# Patient Record
Sex: Female | Born: 1970 | Race: Black or African American | Hispanic: No | Marital: Married | Smoking: Never smoker
Health system: Southern US, Community
[De-identification: ages and names within clinical notes are randomized; demographics above are authoritative.]

## PROBLEM LIST (undated history)

## (undated) HISTORY — PX: TUBAL LIGATION: SHX77

## (undated) HISTORY — PX: APPENDECTOMY: SHX54

---

## 2013-09-23 ENCOUNTER — Ambulatory Visit (INDEPENDENT_AMBULATORY_CARE_PROVIDER_SITE_OTHER): Payer: BC Managed Care – PPO | Admitting: Physician Assistant

## 2013-09-23 VITALS — BP 110/70 | HR 65 | Temp 98.3°F | Resp 16 | Ht 67.5 in | Wt 194.0 lb

## 2013-09-23 DIAGNOSIS — Z0000 Encounter for general adult medical examination without abnormal findings: Secondary | ICD-10-CM

## 2013-09-23 DIAGNOSIS — Z1239 Encounter for other screening for malignant neoplasm of breast: Secondary | ICD-10-CM

## 2013-09-23 DIAGNOSIS — R8281 Pyuria: Secondary | ICD-10-CM

## 2013-09-23 DIAGNOSIS — Z111 Encounter for screening for respiratory tuberculosis: Secondary | ICD-10-CM

## 2013-09-23 LAB — POCT URINALYSIS DIPSTICK
Bilirubin, UA: NEGATIVE
Glucose, UA: NEGATIVE
Ketones, UA: NEGATIVE
Nitrite, UA: POSITIVE
Spec Grav, UA: 1.025
pH, UA: 7

## 2013-09-23 LAB — POCT UA - MICROSCOPIC ONLY: Mucus, UA: NEGATIVE

## 2013-09-23 LAB — POCT CBC
HCT, POC: 42.5 % (ref 37.7–47.9)
Hemoglobin: 13.2 g/dL (ref 12.2–16.2)
Lymph, poc: 2.1 (ref 0.6–3.4)
MCH, POC: 29.6 pg (ref 27–31.2)
MPV: 9.5 fL (ref 0–99.8)
POC MID %: 5.8 %M (ref 0–12)
RBC: 4.46 M/uL (ref 4.04–5.48)
WBC: 5.5 10*3/uL (ref 4.6–10.2)

## 2013-09-23 LAB — COMPREHENSIVE METABOLIC PANEL
ALT: 20 U/L (ref 0–35)
AST: 15 U/L (ref 0–37)
Albumin: 4.2 g/dL (ref 3.5–5.2)
Alkaline Phosphatase: 58 U/L (ref 39–117)
BUN: 9 mg/dL (ref 6–23)
CO2: 29 mEq/L (ref 19–32)
Calcium: 9.1 mg/dL (ref 8.4–10.5)
Chloride: 105 mEq/L (ref 96–112)
Potassium: 3.8 mEq/L (ref 3.5–5.3)
Sodium: 140 mEq/L (ref 135–145)
Total Protein: 7 g/dL (ref 6.0–8.3)

## 2013-09-23 LAB — TSH: TSH: 1.251 u[IU]/mL (ref 0.350–4.500)

## 2013-09-23 NOTE — Patient Instructions (Addendum)
Make your appt for your mammogram.  We will contact you with your labs within the week.

## 2013-09-23 NOTE — Progress Notes (Signed)
[address] [place] [address]   Phone [phone]  Subjective:    Patient ID: Laurie Mitchell, female    DOB: Nov 11, 1971, 42 y.o.   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  She also thinks that she needs a PPD for work.  She had her last CPE 2 years ago with a normal pap test.  Her only concern is that she has breast tenderness. This is not different for her she has been having this for a while and it does not seem to be associated with her menses.     Review of Systems  Constitutional: Negative.   HENT: Negative.   Eyes: Negative.   Respiratory: Negative.   Cardiovascular: Negative.   Gastrointestinal: Negative.   Endocrine: Negative.   Genitourinary: Negative.        Mild vaginal odor.  Musculoskeletal: Negative.   Skin: Negative.   Allergic/Immunologic: Negative.   Neurological: Negative.   Hematological: Negative.   Psychiatric/Behavioral: Negative.        Objective:   Physical Exam  Vitals reviewed. Constitutional: She is oriented to person, place, and time. She appears well-developed and well-nourished.  HENT:  Head: Normocephalic and atraumatic.  Right Ear: Hearing, tympanic membrane, external ear and ear canal normal.  Left Ear: Hearing, tympanic membrane, external ear and ear canal normal.  Nose: Nose normal.  Mouth/Throat: Uvula is midline, oropharynx is clear and moist and mucous membranes are normal.  Eyes: Conjunctivae are normal.  Neck: Neck supple.  Cardiovascular: Normal rate, regular rhythm and normal heart sounds.   No murmur heard. Pulmonary/Chest: Effort normal and breath sounds normal.  Abdominal: Soft. Bowel sounds are normal.  Genitourinary: There is breast tenderness (generalized). No breast swelling, discharge or bleeding.  Musculoskeletal: Normal range of motion.  Lymphadenopathy:    She has no cervical adenopathy.  Neurological: She is alert and oriented to person,  place, and time. She has normal reflexes.  Skin: Skin is warm and dry.  Psychiatric: She has a normal mood and affect. Her behavior is normal. Judgment and thought content normal.   Results for orders placed in visit on 09/23/13  POCT CBC      Result Value Range   WBC 5.5  4.6 - 10.2 K/uL   Lymph, poc 2.1  0.6 - 3.4   POC LYMPH PERCENT 38.0  10 - 50 %L   MID (cbc) 0.3  0 - 0.9   POC MID % 5.8  0 - 12 %M   POC Granulocyte 3.1  2 - 6.9   Granulocyte percent 56.2  37 - 80 %G   RBC 4.46  4.04 - 5.48 M/uL   Hemoglobin 13.2  12.2 - 16.2 g/dL   HCT, POC 65.7  84.6 - 47.9 %   MCV 95.3  80 - 97 fL   MCH, POC 29.6  27 - 31.2 pg   MCHC 31.1 (*) 31.8 - 35.4 g/dL   RDW, POC 96.2     Platelet Count, POC 311  142 - 424 K/uL   MPV 9.5  0 - 99.8 fL  POCT URINALYSIS DIPSTICK      Result Value Range   Color, UA yellow     Clarity, UA cloudy     Glucose, UA neg     Bilirubin, UA neg     Ketones, UA neg     Spec Grav, UA 1.025  Blood, UA moderate     pH, UA 7.0     Protein, UA neg     Urobilinogen, UA 1.0     Nitrite, UA positive     Leukocytes, UA Trace    POCT UA - MICROSCOPIC ONLY      Result Value Range   WBC, Ur, HPF, POC 8-15 with large clumps     RBC, urine, microscopic 0-1     Bacteria, U Microscopic 4++     Mucus, UA neg     Epithelial cells, urine per micros 1-5     Crystals, Ur, HPF, POC neg     Casts, Ur, LPF, POC neg     Yeast, UA neg         Assessment & Plan:  Annual physical exam - Plan: POCT CBC, POCT urinalysis dipstick, Comprehensive metabolic panel, Lipid panel, TSH, POCT UA - Microscopic Only -- form filled out and scanned into media.  Screening-pulmonary TB - Plan: TB Skin Test - pt to RTC in 48-72h for reading  Pus in urine - Plan: Urine culture - send for urine culture due to leukocytes in her urine and her symptoms of vaginal odor. If the odor continues she will RTC for pelvic exam to r/o BV but due to her being on her menses today will not due  it.  Breast cancer screening - Plan: MM Digital Screening - pt will schedule   Benny Lennert PA-C 09/23/2013 11:55 AM

## 2013-09-23 NOTE — Progress Notes (Signed)
  Tuberculosis Risk Questionnaire  1. No Were you born outside the [place] in one of the following parts of the world: [place], [place], [place], [place] or [place]?    2. No Have you traveled outside the [place] and lived for more than one month in one of the following parts of the world: [place], [place], [place], [place] or [place]?    3. No Do you have a compromised immune system such as from any of the following conditions:HIV/AIDS, organ or bone marrow transplantation, diabetes, immunosuppressive medicines (e.g. Prednisone, Remicaide), leukemia, lymphoma, cancer of the head or neck, gastrectomy or jejunal bypass, end-stage renal disease (on dialysis), or silicosis?     4. 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    5. No Have you ever: injected illegal drugs, used crack cocaine, lived in a homeless shelter  or been in jail or prison?     6. Yes  Have you ever been exposed to anyone with infectious tuberculosis?    Tuberculosis Symptom Questionnaire  Do you currently have any of the following symptoms?  1. No Unexplained cough lasting more than 3 weeks?   2. No Unexplained fever lasting more than 3 weeks.   3. No Night Sweats (sweating that leaves the bedclothes and sheets wet)     4. No Shortness of Breath   5. No Chest Pain   6. No Unintentional weight loss    7. No Unexplained fatigue (very tired for no reason)   

## 2013-09-25 ENCOUNTER — Ambulatory Visit (INDEPENDENT_AMBULATORY_CARE_PROVIDER_SITE_OTHER): Payer: BC Managed Care – PPO | Admitting: Radiology

## 2013-09-25 DIAGNOSIS — Z111 Encounter for screening for respiratory tuberculosis: Secondary | ICD-10-CM

## 2013-09-25 LAB — URINE CULTURE

## 2013-09-25 LAB — TB SKIN TEST
Induration: 0 mm
TB Skin Test: NEGATIVE

## 2013-09-25 MED ORDER — CIPROFLOXACIN HCL 500 MG PO TABS: 500.0000 mg | ORAL_TABLET | Freq: Two times a day (BID) | ORAL | Status: DC

## 2013-09-25 NOTE — Addendum Note (Signed)
Addended by: Morrell Riddle on: 09/25/2013 08:53 PM   Modules accepted: Orders

## 2013-10-10 ENCOUNTER — Ambulatory Visit: Payer: Self-pay

## 2013-10-13 ENCOUNTER — Ambulatory Visit: Payer: Self-pay

## 2013-11-15 ENCOUNTER — Ambulatory Visit: Payer: Self-pay

## 2014-01-19 ENCOUNTER — Ambulatory Visit (HOSPITAL_COMMUNITY)
Admission: RE | Admit: 2014-01-19 | Discharge: 2014-01-19 | Disposition: A | Discharge status: Home / Self Care | Payer: BC Managed Care – PPO | Source: Ambulatory Visit | Attending: Physician Assistant | Admitting: Physician Assistant

## 2014-01-19 ENCOUNTER — Other Ambulatory Visit: Payer: Self-pay | Admitting: Physician Assistant

## 2014-01-19 DIAGNOSIS — Z1239 Encounter for other screening for malignant neoplasm of breast: Secondary | ICD-10-CM

## 2014-01-19 DIAGNOSIS — Z1231 Encounter for screening mammogram for malignant neoplasm of breast: Secondary | ICD-10-CM

## 2014-01-23 ENCOUNTER — Telehealth: Payer: Self-pay

## 2014-01-23 ENCOUNTER — Ambulatory Visit: Payer: BC Managed Care – PPO

## 2014-01-23 NOTE — Telephone Encounter (Signed)
Patient came in to [id number] today regarding the results from her mammogram that Benny LennertSarah Weber requested she have. She had the mammogram done earlier this week and would like Benny LennertSarah Weber to call her once she receives the results. Patient states she listed us to receive the records. Patient is also interested in taking vitamins if Maralyn SagoSarah could also discuss that with her. Thank you!

## 2014-01-29 NOTE — Telephone Encounter (Signed)
I just her mammo results to the Rad pool.  I have not gotten the results from her diagnostic mammogram yet.  I would recommend a MVI with iron.

## 2014-01-30 NOTE — Telephone Encounter (Signed)
Notifed pt still waiting on Mammogram resultsand dicussed use of MVI with iron .

## 2014-01-31 ENCOUNTER — Other Ambulatory Visit: Payer: Self-pay | Admitting: Physician Assistant

## 2014-01-31 DIAGNOSIS — R928 Other abnormal and inconclusive findings on diagnostic imaging of breast: Secondary | ICD-10-CM

## 2014-02-12 ENCOUNTER — Ambulatory Visit
Admission: RE | Admit: 2014-02-12 | Discharge: 2014-02-12 | Disposition: A | Discharge status: Home / Self Care | Payer: BC Managed Care – PPO | Source: Ambulatory Visit | Attending: Physician Assistant | Admitting: Physician Assistant

## 2014-02-12 DIAGNOSIS — R928 Other abnormal and inconclusive findings on diagnostic imaging of breast: Secondary | ICD-10-CM

## 2014-02-15 ENCOUNTER — Telehealth: Payer: Self-pay

## 2014-02-15 NOTE — Telephone Encounter (Signed)
Patient wanted to know if she needs to have Pap smear done?  She had one done two years ago and it was normal.

## 2014-02-15 NOTE — Telephone Encounter (Signed)
Spoke to patient gave detailed message, she does not know when she had her last pap, she does still have that same insurance so i advised her to call her insurance company to obtain the date of her last pap. She stated she will call her insurance company and obtain the date of her last exam to ensure coverage for  A future exam. i advised her she was welcome to come to our walk in clinic or make an appointment next door, it was her decision and to simply give us a call when ready.

## 2014-02-15 NOTE — Telephone Encounter (Signed)
Left message on patients machine to call back.  

## 2014-02-15 NOTE — Telephone Encounter (Signed)
It depends on the pap test that she had two years ago. It was not done here and it is not in [facility], so I don't know.  If she had "just" a pap test, she needs another next year (cytology alone Q3 years).  If she had a pap test AND a negative HPV test, repeat both in 3 years (Q5 years).

## 2014-02-19 NOTE — Telephone Encounter (Signed)
Pt is needing to get a rx for a laxative something to cleans the body

## 2014-02-20 ENCOUNTER — Telehealth: Payer: Self-pay

## 2014-02-20 NOTE — Telephone Encounter (Signed)
Please call patient as soon as possible. Patient needs information from medical staff and/or PA Benny LennertSarah Weber.     Thank You!!!

## 2014-02-20 NOTE — Telephone Encounter (Signed)
Please get information

## 2014-02-20 NOTE — Telephone Encounter (Signed)
Patient needs to speak to Laurie LennertSarah Mitchell ASAP!!!

## 2014-02-20 NOTE — Telephone Encounter (Signed)
She should try something over the counter if she is constipated. Left message for her to call back so we can advise. Miralax may help she should also increase her water intake and increase her fiber.

## 2014-02-21 NOTE — Telephone Encounter (Signed)
Patient advised to use OTC medications as stated in previous message.

## 2014-02-21 NOTE — Telephone Encounter (Signed)
Pt called questioning why we could not call in a colon cleanse. Advised pt of OTC recommendations. Amy had previously left detailed message on her machine. Pt voiced understanding.

## 2014-02-22 ENCOUNTER — Encounter: Payer: Self-pay | Admitting: Obstetrics & Gynecology

## 2014-04-28 ENCOUNTER — Telehealth: Payer: Self-pay

## 2014-04-28 NOTE — Telephone Encounter (Signed)
Patient states she has a question about some lab work she had done a while back. She says on one of her results related to bacteria it had plus signs after it and she wants to know what that means. She was wondering if her result was positive or not. Cb# [phone]

## 2014-04-29 NOTE — Telephone Encounter (Signed)
Lm for rtn call 

## 2014-04-29 NOTE — Telephone Encounter (Signed)
Spoke to pt about lab results from September 2014

## 2016-01-23 IMAGING — MG MM DIGITAL DIAGNOSTIC UNILAT*R*
2 series · 2 of 2 positions shown · non-contrast
Comparison: Prior examinations dating back to 09/06/2007.

CLINICAL DATA: Patient recalled from screening for right breast
calcifications.

EXAM:
DIGITAL DIAGNOSTIC  RIGHT MAMMOGRAM

[R CC]
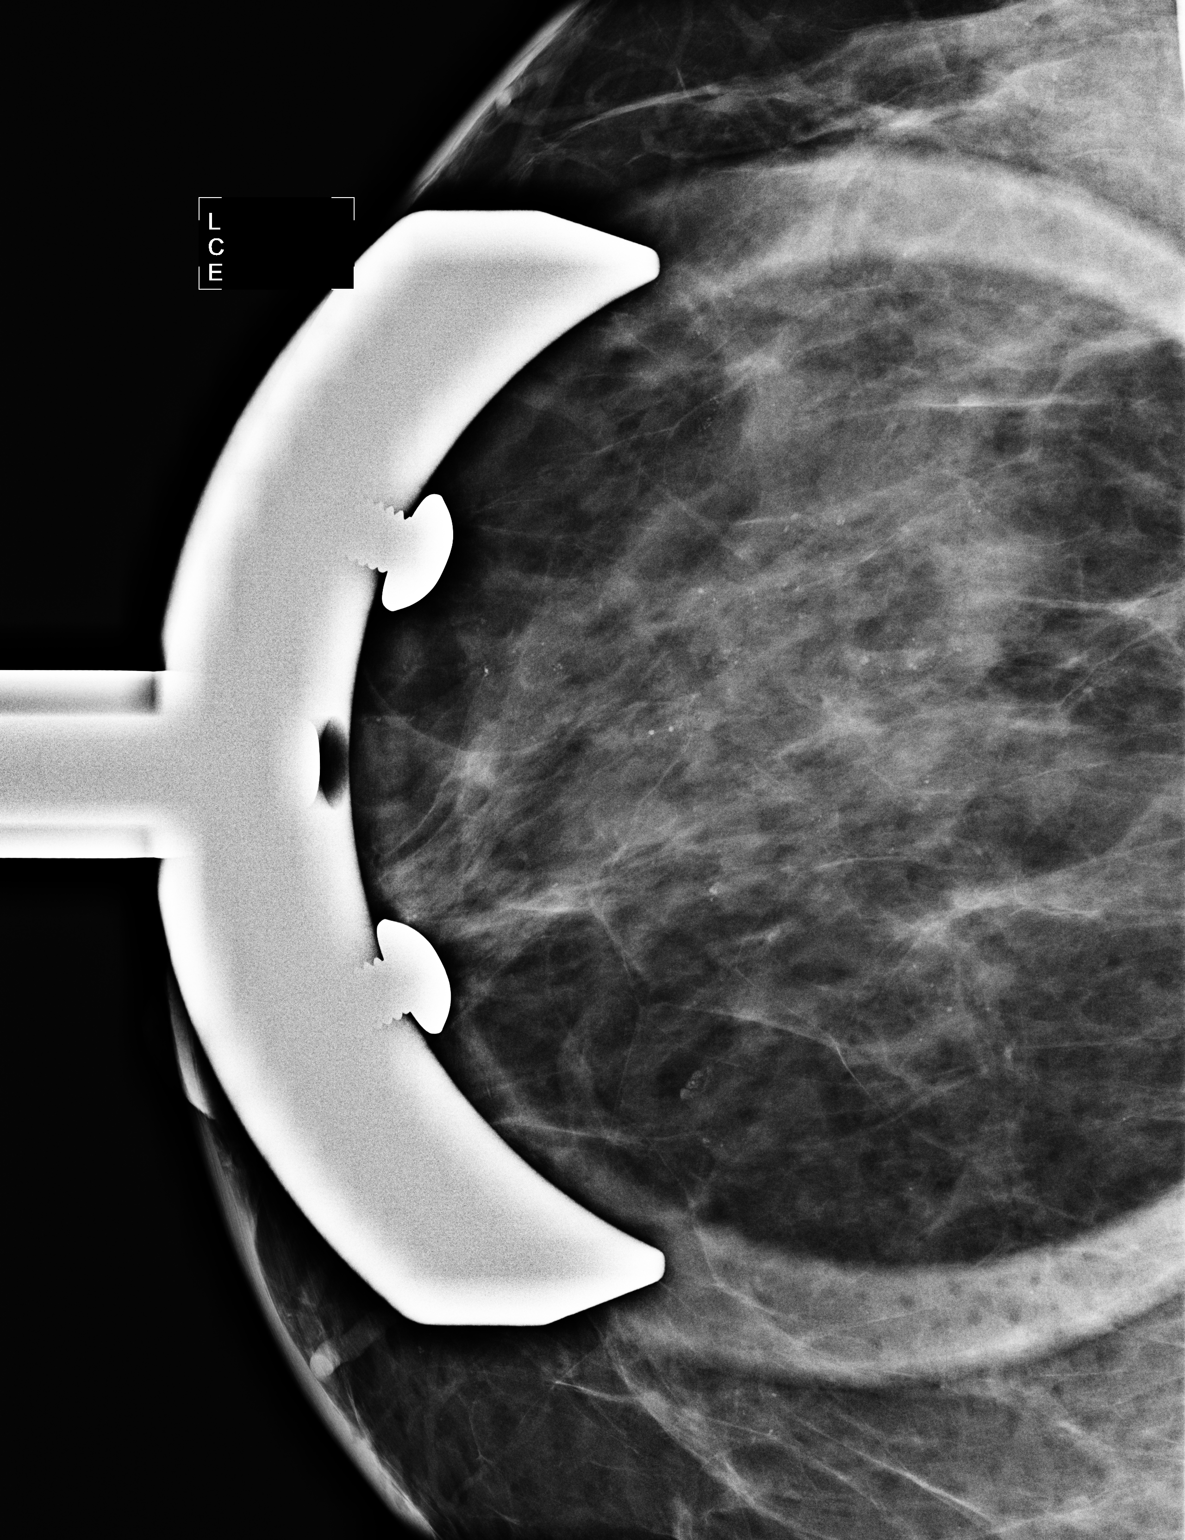

[R ML]
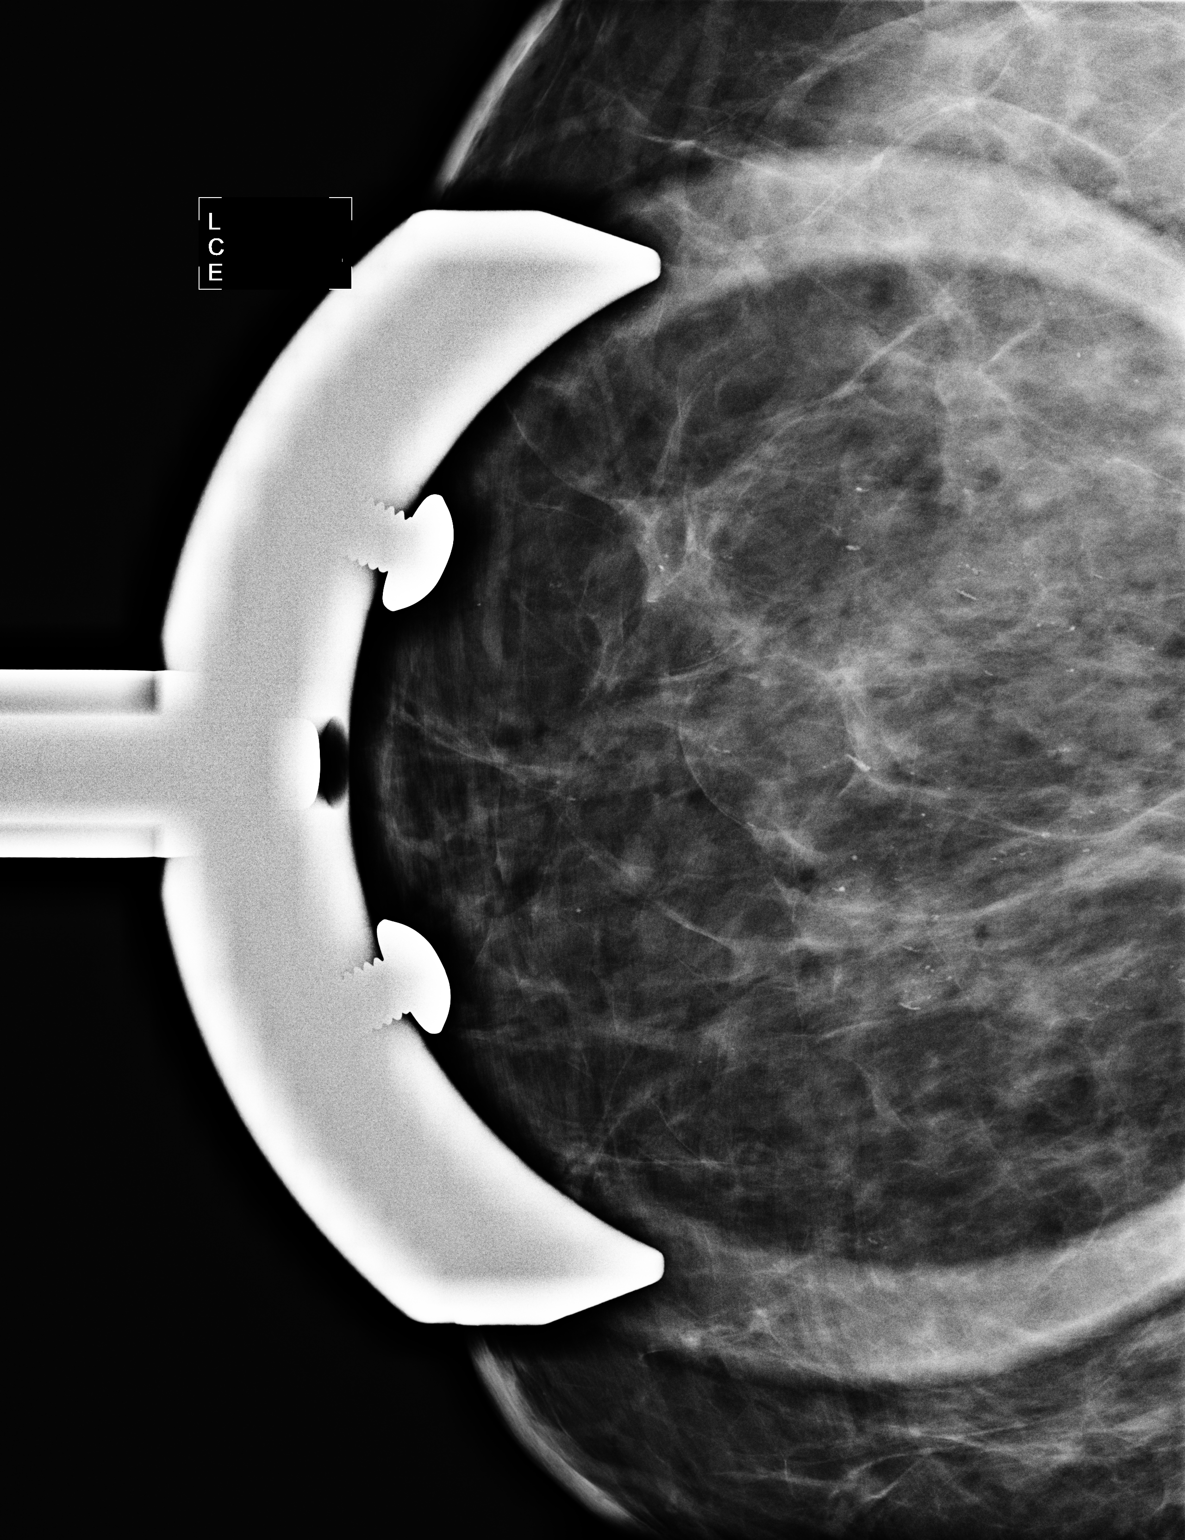

[2 of 2 positions shown; findings below may reference images not displayed]

ACR Breast Density Category c: The breast tissue is heterogeneously
dense, which may obscure small masses.
FINDINGS: Spot compression magnification CC and ML views demonstrate a region
of calcifications within the 12 o'clock position right breast which
layer on true lateral view compatible with milk of calcium.
IMPRESSION: Benign milk-of-calcium right breast.

RECOMMENDATION:
Screening mammogram in one year.(Code:[id number])

I have discussed the findings and recommendations with the patient.
Results were also provided in writing at the conclusion of the
visit. If applicable, a reminder letter will be sent to the patient
regarding the next appointment.

BI-RADS CATEGORY  2: Benign finding(s).

## 2023-03-13 ENCOUNTER — Emergency Department (HOSPITAL_COMMUNITY): Payer: 59

## 2023-03-13 ENCOUNTER — Inpatient Hospital Stay (HOSPITAL_COMMUNITY): Payer: 59

## 2023-03-13 ENCOUNTER — Encounter (HOSPITAL_COMMUNITY)
Admission: EM | Disposition: A | Discharge status: Home / Self Care | Payer: Self-pay | Source: Home / Self Care | Attending: Internal Medicine

## 2023-03-13 ENCOUNTER — Other Ambulatory Visit: Payer: Self-pay

## 2023-03-13 ENCOUNTER — Inpatient Hospital Stay (HOSPITAL_COMMUNITY): Payer: 59 | Admitting: Certified Registered Nurse Anesthetist

## 2023-03-13 ENCOUNTER — Inpatient Hospital Stay (HOSPITAL_COMMUNITY)
Admission: EM | Admit: 2023-03-13 | Discharge: 2023-03-17 | DRG: 481 | Disposition: A | Discharge status: Home / Self Care | Payer: 59 | Attending: Internal Medicine | Admitting: Internal Medicine

## 2023-03-13 ENCOUNTER — Encounter (HOSPITAL_COMMUNITY): Payer: Self-pay

## 2023-03-13 DIAGNOSIS — S72321A Displaced transverse fracture of shaft of right femur, initial encounter for closed fracture: Secondary | ICD-10-CM | POA: Diagnosis present

## 2023-03-13 DIAGNOSIS — Z6828 Body mass index (BMI) 28.0-28.9, adult: Secondary | ICD-10-CM | POA: Diagnosis not present

## 2023-03-13 DIAGNOSIS — Y92410 Unspecified street and highway as the place of occurrence of the external cause: Secondary | ICD-10-CM | POA: Diagnosis not present

## 2023-03-13 DIAGNOSIS — Z833 Family history of diabetes mellitus: Secondary | ICD-10-CM

## 2023-03-13 DIAGNOSIS — S51011A Laceration without foreign body of right elbow, initial encounter: Secondary | ICD-10-CM | POA: Diagnosis present

## 2023-03-13 DIAGNOSIS — Z794 Long term (current) use of insulin: Secondary | ICD-10-CM

## 2023-03-13 DIAGNOSIS — Z1152 Encounter for screening for COVID-19: Secondary | ICD-10-CM

## 2023-03-13 DIAGNOSIS — E876 Hypokalemia: Secondary | ICD-10-CM | POA: Diagnosis present

## 2023-03-13 DIAGNOSIS — Z9851 Tubal ligation status: Secondary | ICD-10-CM

## 2023-03-13 DIAGNOSIS — Z9049 Acquired absence of other specified parts of digestive tract: Secondary | ICD-10-CM | POA: Diagnosis not present

## 2023-03-13 DIAGNOSIS — E663 Overweight: Secondary | ICD-10-CM | POA: Diagnosis present

## 2023-03-13 DIAGNOSIS — S7291XA Unspecified fracture of right femur, initial encounter for closed fracture: Secondary | ICD-10-CM | POA: Diagnosis not present

## 2023-03-13 DIAGNOSIS — Z23 Encounter for immunization: Secondary | ICD-10-CM | POA: Diagnosis not present

## 2023-03-13 DIAGNOSIS — E1165 Type 2 diabetes mellitus with hyperglycemia: Secondary | ICD-10-CM | POA: Diagnosis present

## 2023-03-13 DIAGNOSIS — M79651 Pain in right thigh: Secondary | ICD-10-CM | POA: Diagnosis present

## 2023-03-13 DIAGNOSIS — D72829 Elevated white blood cell count, unspecified: Secondary | ICD-10-CM | POA: Diagnosis present

## 2023-03-13 DIAGNOSIS — T1490XA Injury, unspecified, initial encounter: Secondary | ICD-10-CM | POA: Insufficient documentation

## 2023-03-13 DIAGNOSIS — R9389 Abnormal findings on diagnostic imaging of other specified body structures: Secondary | ICD-10-CM | POA: Diagnosis not present

## 2023-03-13 DIAGNOSIS — D649 Anemia, unspecified: Secondary | ICD-10-CM | POA: Diagnosis not present

## 2023-03-13 DIAGNOSIS — E872 Acidosis, unspecified: Secondary | ICD-10-CM | POA: Diagnosis present

## 2023-03-13 DIAGNOSIS — R739 Hyperglycemia, unspecified: Secondary | ICD-10-CM

## 2023-03-13 DIAGNOSIS — S72351A Displaced comminuted fracture of shaft of right femur, initial encounter for closed fracture: Secondary | ICD-10-CM

## 2023-03-13 HISTORY — PX: FEMUR IM NAIL: SHX1597

## 2023-03-13 LAB — COMPREHENSIVE METABOLIC PANEL
ALT: 33 U/L (ref 0–44)
AST: 43 U/L — ABNORMAL HIGH (ref 15–41)
Albumin: 3.6 g/dL (ref 3.5–5.0)
Alkaline Phosphatase: 70 U/L (ref 38–126)
Anion gap: 10 (ref 5–15)
BUN: 8 mg/dL (ref 6–20)
CO2: 23 mmol/L (ref 22–32)
Calcium: 8.8 mg/dL — ABNORMAL LOW (ref 8.9–10.3)
Chloride: 103 mmol/L (ref 98–111)
Creatinine, Ser: 0.79 mg/dL (ref 0.44–1.00)
GFR, Estimated: >60 mL/min (ref >60)
Glucose, Bld: 255 mg/dL — ABNORMAL HIGH (ref 70–99)
Potassium: 2.8 mmol/L — ABNORMAL LOW (ref 3.5–5.1)
Sodium: 136 mmol/L (ref 135–145)
Total Bilirubin: 0.7 mg/dL (ref 0.3–1.2)
Total Protein: 6.8 g/dL (ref 6.5–8.1)

## 2023-03-13 LAB — I-STAT CHEM 8, ED
BUN: 10 mg/dL (ref 6–20)
Calcium, Ion: 1.12 mmol/L — ABNORMAL LOW (ref 1.15–1.40)
Chloride: 103 mmol/L (ref 98–111)
Creatinine, Ser: 0.7 mg/dL (ref 0.44–1.00)
Glucose, Bld: 252 mg/dL — ABNORMAL HIGH (ref 70–99)
HCT: 42 % (ref 36.0–46.0)
Hemoglobin: 14.3 g/dL (ref 12.0–15.0)
Potassium: 2.8 mmol/L — ABNORMAL LOW (ref 3.5–5.1)
Sodium: 138 mmol/L (ref 135–145)
TCO2: 24 mmol/L (ref 22–32)

## 2023-03-13 LAB — CBC
HCT: 40.9 % (ref 36.0–46.0)
Hemoglobin: 13.4 g/dL (ref 12.0–15.0)
MCH: 29.7 pg (ref 26.0–34.0)
MCHC: 32.8 g/dL (ref 30.0–36.0)
MCV: 90.7 fL (ref 80.0–100.0)
Platelets: 294 10*3/uL (ref 150–400)
RBC: 4.51 MIL/uL (ref 3.87–5.11)
RDW: 13.1 % (ref 11.5–15.5)
WBC: 14 10*3/uL — ABNORMAL HIGH (ref 4.0–10.5)
nRBC: 0 % (ref 0.0–0.2)

## 2023-03-13 LAB — LACTIC ACID, PLASMA: Lactic Acid, Venous: 2.2 mmol/L (ref 0.5–1.9)

## 2023-03-13 LAB — PROTIME-INR
INR: 1.1 (ref 0.8–1.2)
Prothrombin Time: 13.9 seconds (ref 11.4–15.2)

## 2023-03-13 LAB — TYPE AND SCREEN
ABO/RH(D): O POS
Antibody Screen: NEGATIVE

## 2023-03-13 LAB — C-REACTIVE PROTEIN: CRP: 4.7 mg/dL — ABNORMAL HIGH (ref <1.0)

## 2023-03-13 LAB — GLUCOSE, CAPILLARY
Glucose-Capillary: 311 mg/dL — ABNORMAL HIGH (ref 70–99)
Glucose-Capillary: 250 mg/dL — ABNORMAL HIGH (ref 70–99)
Glucose-Capillary: 239 mg/dL — ABNORMAL HIGH (ref 70–99)
Glucose-Capillary: 234 mg/dL — ABNORMAL HIGH (ref 70–99)
Glucose-Capillary: 278 mg/dL — ABNORMAL HIGH (ref 70–99)
Glucose-Capillary: 229 mg/dL — ABNORMAL HIGH (ref 70–99)

## 2023-03-13 LAB — ABO/RH: ABO/RH(D): O POS

## 2023-03-13 LAB — I-STAT BETA HCG BLOOD, ED (MC, WL, AP ONLY): I-stat hCG, quantitative: <5 m[IU]/mL (ref <5)

## 2023-03-13 LAB — HIV ANTIBODY (ROUTINE TESTING W REFLEX): HIV Screen 4th Generation wRfx: NONREACTIVE

## 2023-03-13 LAB — ETHANOL: Alcohol, Ethyl (B): <10 mg/dL (ref <10)

## 2023-03-13 SURGERY — INSERTION, INTRAMEDULLARY ROD, FEMUR
Anesthesia: General | Laterality: Right

## 2023-03-13 MED ORDER — CEFAZOLIN SODIUM-DEXTROSE 2-4 GM/100ML-% IV SOLN
2.0000 g | Freq: Once | INTRAVENOUS | Status: AC
  Administered 2023-03-13: 2 g via INTRAVENOUS
  Filled 2023-03-13: qty 100

## 2023-03-13 MED ORDER — PROMETHAZINE HCL 25 MG/ML IJ SOLN: 6.2500 mg | INTRAMUSCULAR | Status: DC | PRN

## 2023-03-13 MED ORDER — ACETAMINOPHEN 10 MG/ML IV SOLN
INTRAVENOUS | Status: AC
  Filled 2023-03-13: qty 100

## 2023-03-13 MED ORDER — FENTANYL CITRATE (PF) 250 MCG/5ML IJ SOLN
INTRAMUSCULAR | Status: DC | PRN
  Administered 2023-03-13: 50 ug via INTRAVENOUS
  Administered 2023-03-13 (×2): 100 ug via INTRAVENOUS

## 2023-03-13 MED ORDER — SUGAMMADEX SODIUM 200 MG/2ML IV SOLN
INTRAVENOUS | Status: DC | PRN
  Administered 2023-03-13: 200 mg via INTRAVENOUS

## 2023-03-13 MED ORDER — FENTANYL CITRATE (PF) 100 MCG/2ML IJ SOLN: 25.0000 ug | INTRAMUSCULAR | Status: DC | PRN

## 2023-03-13 MED ORDER — TRANEXAMIC ACID-NACL 1000-0.7 MG/100ML-% IV SOLN
INTRAVENOUS | Status: AC
  Filled 2023-03-13: qty 100

## 2023-03-13 MED ORDER — TRANEXAMIC ACID-NACL 1000-0.7 MG/100ML-% IV SOLN
1000.0000 mg | Freq: Once | INTRAVENOUS | Status: AC
  Administered 2023-03-13: 1000 mg via INTRAVENOUS
  Filled 2023-03-13: qty 100

## 2023-03-13 MED ORDER — PHENOL 1.4 % MT LIQD: 1.0000 | OROMUCOSAL | Status: DC | PRN

## 2023-03-13 MED ORDER — ONDANSETRON HCL 4 MG PO TABS: 4.0000 mg | ORAL_TABLET | Freq: Four times a day (QID) | ORAL | Status: DC | PRN

## 2023-03-13 MED ORDER — IOHEXOL 350 MG/ML SOLN
75.0000 mL | Freq: Once | INTRAVENOUS | Status: AC | PRN
  Administered 2023-03-13: 75 mL via INTRAVENOUS

## 2023-03-13 MED ORDER — OXYCODONE HCL 5 MG PO TABS
10.0000 mg | ORAL_TABLET | ORAL | Status: DC | PRN
  Administered 2023-03-14 – 2023-03-16 (×4): 15 mg via ORAL
  Administered 2023-03-17: 10 mg via ORAL
  Administered 2023-03-17: 15 mg via ORAL
  Administered 2023-03-17: 10 mg via ORAL
  Filled 2023-03-13 (×6): qty 3

## 2023-03-13 MED ORDER — FENTANYL CITRATE (PF) 250 MCG/5ML IJ SOLN
INTRAMUSCULAR | Status: AC
  Filled 2023-03-13: qty 5

## 2023-03-13 MED ORDER — ONDANSETRON HCL 4 MG/2ML IJ SOLN
INTRAMUSCULAR | Status: DC | PRN
  Administered 2023-03-13: 4 mg via INTRAVENOUS

## 2023-03-13 MED ORDER — VANCOMYCIN HCL 1000 MG IV SOLR
INTRAVENOUS | Status: AC
  Filled 2023-03-13: qty 20

## 2023-03-13 MED ORDER — METOCLOPRAMIDE HCL 5 MG/ML IJ SOLN: 5.0000 mg | Freq: Three times a day (TID) | INTRAMUSCULAR | Status: DC | PRN

## 2023-03-13 MED ORDER — ORAL CARE MOUTH RINSE: 15.0000 mL | Freq: Once | OROMUCOSAL | Status: AC

## 2023-03-13 MED ORDER — INSULIN GLARGINE-YFGN 100 UNIT/ML ~~LOC~~ SOLN
5.0000 [IU] | Freq: Every day | SUBCUTANEOUS | Status: DC
  Administered 2023-03-13 – 2023-03-14 (×2): 5 [IU] via SUBCUTANEOUS
  Filled 2023-03-13 (×2): qty 0.05

## 2023-03-13 MED ORDER — LACTATED RINGERS IV SOLN: INTRAVENOUS | Status: DC

## 2023-03-13 MED ORDER — CEFAZOLIN SODIUM-DEXTROSE 2-4 GM/100ML-% IV SOLN
2.0000 g | Freq: Four times a day (QID) | INTRAVENOUS | Status: AC
  Administered 2023-03-13 – 2023-03-14 (×2): 2 g via INTRAVENOUS
  Filled 2023-03-13 (×2): qty 100

## 2023-03-13 MED ORDER — ACETAMINOPHEN 10 MG/ML IV SOLN
INTRAVENOUS | Status: DC | PRN
  Administered 2023-03-13: 1000 mg via INTRAVENOUS

## 2023-03-13 MED ORDER — ONDANSETRON HCL 4 MG/2ML IJ SOLN
4.0000 mg | Freq: Once | INTRAMUSCULAR | Status: AC
  Administered 2023-03-13: 4 mg via INTRAVENOUS
  Filled 2023-03-13: qty 2

## 2023-03-13 MED ORDER — MIDAZOLAM HCL 2 MG/2ML IJ SOLN
INTRAMUSCULAR | Status: AC
  Filled 2023-03-13: qty 2

## 2023-03-13 MED ORDER — SENNOSIDES-DOCUSATE SODIUM 8.6-50 MG PO TABS
1.0000 | ORAL_TABLET | Freq: Every day | ORAL | Status: DC
  Administered 2023-03-13 – 2023-03-15 (×3): 1 via ORAL
  Filled 2023-03-13 (×3): qty 1

## 2023-03-13 MED ORDER — ENOXAPARIN SODIUM 40 MG/0.4ML IJ SOSY: 40.0000 mg | PREFILLED_SYRINGE | INTRAMUSCULAR | Status: DC

## 2023-03-13 MED ORDER — DEXAMETHASONE SODIUM PHOSPHATE 10 MG/ML IJ SOLN
INTRAMUSCULAR | Status: AC
  Filled 2023-03-13: qty 1

## 2023-03-13 MED ORDER — TETANUS-DIPHTH-ACELL PERTUSSIS 5-2.5-18.5 LF-MCG/0.5 IM SUSY
0.5000 mL | PREFILLED_SYRINGE | Freq: Once | INTRAMUSCULAR | Status: AC
  Administered 2023-03-13: 0.5 mL via INTRAMUSCULAR
  Filled 2023-03-13: qty 0.5

## 2023-03-13 MED ORDER — ACETAMINOPHEN 325 MG PO TABS: 325.0000 mg | ORAL_TABLET | Freq: Four times a day (QID) | ORAL | Status: DC | PRN

## 2023-03-13 MED ORDER — CHLORHEXIDINE GLUCONATE 0.12 % MT SOLN
OROMUCOSAL | Status: AC
  Administered 2023-03-13: 15 mL via OROMUCOSAL
  Filled 2023-03-13: qty 15

## 2023-03-13 MED ORDER — CEFAZOLIN SODIUM-DEXTROSE 2-4 GM/100ML-% IV SOLN
2.0000 g | INTRAVENOUS | Status: AC
  Administered 2023-03-13: 2 g via INTRAVENOUS

## 2023-03-13 MED ORDER — ENOXAPARIN SODIUM 40 MG/0.4ML IJ SOSY: 40.0000 mg | PREFILLED_SYRINGE | INTRAMUSCULAR | 0 refills | Status: DC

## 2023-03-13 MED ORDER — ACETAMINOPHEN 500 MG PO TABS
1000.0000 mg | ORAL_TABLET | Freq: Four times a day (QID) | ORAL | Status: AC
  Administered 2023-03-13: 1000 mg via ORAL
  Filled 2023-03-13 (×2): qty 2

## 2023-03-13 MED ORDER — ONDANSETRON HCL 4 MG/2ML IJ SOLN
INTRAMUSCULAR | Status: AC
  Filled 2023-03-13: qty 2

## 2023-03-13 MED ORDER — TRANEXAMIC ACID-NACL 1000-0.7 MG/100ML-% IV SOLN
1000.0000 mg | INTRAVENOUS | Status: AC
  Administered 2023-03-13: 1000 mg via INTRAVENOUS

## 2023-03-13 MED ORDER — ONDANSETRON HCL 4 MG/2ML IJ SOLN
4.0000 mg | Freq: Four times a day (QID) | INTRAMUSCULAR | Status: DC | PRN
  Administered 2023-03-16: 4 mg via INTRAVENOUS
  Filled 2023-03-13: qty 2

## 2023-03-13 MED ORDER — GLUCERNA SHAKE PO LIQD
237.0000 mL | Freq: Two times a day (BID) | ORAL | Status: DC
  Administered 2023-03-14 – 2023-03-17 (×2): 237 mL via ORAL

## 2023-03-13 MED ORDER — DOCUSATE SODIUM 100 MG PO CAPS
100.0000 mg | ORAL_CAPSULE | Freq: Two times a day (BID) | ORAL | Status: DC
  Administered 2023-03-13 – 2023-03-17 (×8): 100 mg via ORAL
  Filled 2023-03-13 (×8): qty 1

## 2023-03-13 MED ORDER — CHLORHEXIDINE GLUCONATE 0.12 % MT SOLN: 15.0000 mL | Freq: Once | OROMUCOSAL | Status: AC

## 2023-03-13 MED ORDER — SODIUM CHLORIDE 0.9 % IV SOLN: INTRAVENOUS | Status: DC

## 2023-03-13 MED ORDER — CALCIUM GLUCONATE-NACL 1-0.675 GM/50ML-% IV SOLN
1.0000 g | Freq: Once | INTRAVENOUS | Status: AC
  Administered 2023-03-13: 1000 mg via INTRAVENOUS
  Filled 2023-03-13 (×2): qty 50

## 2023-03-13 MED ORDER — DEXMEDETOMIDINE HCL IN NACL 80 MCG/20ML IV SOLN
INTRAVENOUS | Status: DC | PRN
  Administered 2023-03-13 (×2): 8 ug via BUCCAL

## 2023-03-13 MED ORDER — ALBUTEROL SULFATE (2.5 MG/3ML) 0.083% IN NEBU: 2.5000 mg | INHALATION_SOLUTION | Freq: Four times a day (QID) | RESPIRATORY_TRACT | Status: DC | PRN

## 2023-03-13 MED ORDER — ENOXAPARIN SODIUM 40 MG/0.4ML IJ SOSY
40.0000 mg | PREFILLED_SYRINGE | INTRAMUSCULAR | Status: DC
  Administered 2023-03-14 – 2023-03-17 (×4): 40 mg via SUBCUTANEOUS
  Filled 2023-03-13 (×4): qty 0.4

## 2023-03-13 MED ORDER — PHENYLEPHRINE HCL (PRESSORS) 10 MG/ML IV SOLN
INTRAVENOUS | Status: DC | PRN
  Administered 2023-03-13 (×3): 80 ug via INTRAVENOUS

## 2023-03-13 MED ORDER — HYDROMORPHONE HCL 1 MG/ML IJ SOLN
0.5000 mg | INTRAMUSCULAR | Status: DC | PRN
  Administered 2023-03-13 (×2): 0.5 mg via INTRAVENOUS
  Filled 2023-03-13: qty 1
  Filled 2023-03-13: qty 0.5
  Filled 2023-03-13: qty 1

## 2023-03-13 MED ORDER — VANCOMYCIN HCL 1000 MG IV SOLR
INTRAVENOUS | Status: DC | PRN
  Administered 2023-03-13: 1000 mg via TOPICAL

## 2023-03-13 MED ORDER — OXYCODONE HCL 5 MG PO TABS: 5.0000 mg | ORAL_TABLET | ORAL | 0 refills | Status: AC | PRN

## 2023-03-13 MED ORDER — HYDROMORPHONE HCL 1 MG/ML IJ SOLN
INTRAMUSCULAR | Status: AC
  Filled 2023-03-13: qty 0.5

## 2023-03-13 MED ORDER — POTASSIUM CHLORIDE CRYS ER 20 MEQ PO TBCR: 60.0000 meq | EXTENDED_RELEASE_TABLET | ORAL | Status: DC

## 2023-03-13 MED ORDER — LACTATED RINGERS IV SOLN: INTRAVENOUS | Status: DC | PRN

## 2023-03-13 MED ORDER — ROCURONIUM BROMIDE 10 MG/ML (PF) SYRINGE
PREFILLED_SYRINGE | INTRAVENOUS | Status: AC
  Filled 2023-03-13: qty 10

## 2023-03-13 MED ORDER — POTASSIUM CHLORIDE 10 MEQ/100ML IV SOLN
10.0000 meq | INTRAVENOUS | Status: AC
  Administered 2023-03-13 (×2): 10 meq via INTRAVENOUS
  Filled 2023-03-13 (×2): qty 100

## 2023-03-13 MED ORDER — HYDROMORPHONE HCL 1 MG/ML IJ SOLN
0.5000 mg | INTRAMUSCULAR | Status: DC | PRN
  Administered 2023-03-14 – 2023-03-16 (×4): 1 mg via INTRAVENOUS
  Filled 2023-03-13 (×5): qty 1

## 2023-03-13 MED ORDER — MENTHOL 3 MG MT LOZG: 1.0000 | LOZENGE | OROMUCOSAL | Status: DC | PRN

## 2023-03-13 MED ORDER — PHENYLEPHRINE 80 MCG/ML (10ML) SYRINGE FOR IV PUSH (FOR BLOOD PRESSURE SUPPORT)
PREFILLED_SYRINGE | INTRAVENOUS | Status: AC
  Filled 2023-03-13: qty 20

## 2023-03-13 MED ORDER — INSULIN ASPART 100 UNIT/ML IJ SOLN
0.0000 [IU] | INTRAMUSCULAR | Status: DC
  Administered 2023-03-13: 5 [IU] via SUBCUTANEOUS
  Administered 2023-03-13: 7 [IU] via SUBCUTANEOUS
  Administered 2023-03-14 (×3): 3 [IU] via SUBCUTANEOUS

## 2023-03-13 MED ORDER — CEFAZOLIN SODIUM-DEXTROSE 2-4 GM/100ML-% IV SOLN
INTRAVENOUS | Status: AC
  Filled 2023-03-13: qty 100

## 2023-03-13 MED ORDER — METOCLOPRAMIDE HCL 5 MG PO TABS: 5.0000 mg | ORAL_TABLET | Freq: Three times a day (TID) | ORAL | Status: DC | PRN

## 2023-03-13 MED ORDER — SUCCINYLCHOLINE CHLORIDE 200 MG/10ML IV SOSY
PREFILLED_SYRINGE | INTRAVENOUS | Status: AC
  Filled 2023-03-13: qty 10

## 2023-03-13 MED ORDER — HYDRALAZINE HCL 20 MG/ML IJ SOLN: 10.0000 mg | INTRAMUSCULAR | Status: DC | PRN

## 2023-03-13 MED ORDER — POVIDONE-IODINE 10 % EX SWAB
2.0000 | Freq: Once | CUTANEOUS | Status: AC
  Administered 2023-03-13: 2 via TOPICAL

## 2023-03-13 MED ORDER — PHENYLEPHRINE 80 MCG/ML (10ML) SYRINGE FOR IV PUSH (FOR BLOOD PRESSURE SUPPORT)
PREFILLED_SYRINGE | INTRAVENOUS | Status: DC | PRN
  Administered 2023-03-13: 120 ug via INTRAVENOUS

## 2023-03-13 MED ORDER — OXYCODONE HCL 5 MG PO TABS: 5.0000 mg | ORAL_TABLET | ORAL | Status: DC | PRN

## 2023-03-13 MED ORDER — ROCURONIUM BROMIDE 10 MG/ML (PF) SYRINGE
PREFILLED_SYRINGE | INTRAVENOUS | Status: DC | PRN
  Administered 2023-03-13: 60 mg via INTRAVENOUS

## 2023-03-13 MED ORDER — CHLORHEXIDINE GLUCONATE 4 % EX LIQD
60.0000 mL | Freq: Once | CUTANEOUS | Status: AC
  Administered 2023-03-13: 4 via TOPICAL
  Filled 2023-03-13: qty 60

## 2023-03-13 MED ORDER — EPHEDRINE 5 MG/ML INJ
INTRAVENOUS | Status: AC
  Filled 2023-03-13: qty 5

## 2023-03-13 MED ORDER — 0.9 % SODIUM CHLORIDE (POUR BTL) OPTIME
TOPICAL | Status: DC | PRN
  Administered 2023-03-13: 1000 mL

## 2023-03-13 MED ORDER — LIDOCAINE-EPINEPHRINE (PF) 2 %-1:200000 IJ SOLN
20.0000 mL | Freq: Once | INTRAMUSCULAR | Status: AC
  Administered 2023-03-13: 20 mL
  Filled 2023-03-13: qty 20

## 2023-03-13 MED ORDER — PROPOFOL 10 MG/ML IV BOLUS
INTRAVENOUS | Status: DC | PRN
  Administered 2023-03-13: 140 mg via INTRAVENOUS

## 2023-03-13 MED ORDER — LIDOCAINE 2% (20 MG/ML) 5 ML SYRINGE
INTRAMUSCULAR | Status: DC | PRN
  Administered 2023-03-13: 80 mg via INTRAVENOUS

## 2023-03-13 MED ORDER — PROPOFOL 10 MG/ML IV BOLUS
INTRAVENOUS | Status: AC
  Filled 2023-03-13: qty 20

## 2023-03-13 MED ORDER — ARTIFICIAL TEARS OPHTHALMIC OINT
TOPICAL_OINTMENT | OPHTHALMIC | Status: AC
  Filled 2023-03-13: qty 3.5

## 2023-03-13 MED ORDER — FENTANYL CITRATE PF 50 MCG/ML IJ SOSY
50.0000 ug | PREFILLED_SYRINGE | Freq: Once | INTRAMUSCULAR | Status: AC
  Administered 2023-03-13: 50 ug via INTRAVENOUS
  Filled 2023-03-13: qty 1

## 2023-03-13 MED ORDER — HYDROMORPHONE HCL 1 MG/ML IJ SOLN
INTRAMUSCULAR | Status: DC | PRN
  Administered 2023-03-13: .5 mg via INTRAVENOUS

## 2023-03-13 MED ORDER — MIDAZOLAM HCL 2 MG/2ML IJ SOLN
INTRAMUSCULAR | Status: DC | PRN
  Administered 2023-03-13 (×2): 1 mg via INTRAVENOUS

## 2023-03-13 MED ORDER — OXYCODONE HCL 5 MG PO TABS
5.0000 mg | ORAL_TABLET | ORAL | Status: DC | PRN
  Administered 2023-03-13 – 2023-03-14 (×3): 10 mg via ORAL
  Administered 2023-03-15 (×2): 5 mg via ORAL
  Administered 2023-03-16: 10 mg via ORAL
  Filled 2023-03-13 (×7): qty 2

## 2023-03-13 MED ORDER — LIDOCAINE 2% (20 MG/ML) 5 ML SYRINGE
INTRAMUSCULAR | Status: AC
  Filled 2023-03-13: qty 5

## 2023-03-13 SURGICAL SUPPLY — 42 items
ADH SKN CLS APL DERMABOND .7 (GAUZE/BANDAGES/DRESSINGS) ×2
APL PRP STRL LF DISP 70% ISPRP (MISCELLANEOUS) ×2
BAG COUNTER SPONGE SURGICOUNT (BAG) ×1 IMPLANT
BAG SPNG CNTER NS LX DISP (BAG) ×1
BIT DRILL 4.0X165 AO STYLE (BIT) IMPLANT
CHLORAPREP W/TINT 26 (MISCELLANEOUS) ×1 IMPLANT
COVER PERINEAL POST (MISCELLANEOUS) ×1 IMPLANT
COVER SURGICAL LIGHT HANDLE (MISCELLANEOUS) ×1 IMPLANT
DERMABOND ADVANCED .7 DNX12 (GAUZE/BANDAGES/DRESSINGS) ×2 IMPLANT
DRAPE C-ARM 42X72 X-RAY (DRAPES) ×1 IMPLANT
DRAPE C-ARMOR (DRAPES) ×1 IMPLANT
DRAPE STERI IOBAN 125X83 (DRAPES) ×1 IMPLANT
DRSG AQUACEL AG ADV 3.5X 4 (GAUZE/BANDAGES/DRESSINGS) IMPLANT
DRSG AQUACEL AG ADV 3.5X 6 (GAUZE/BANDAGES/DRESSINGS) IMPLANT
DRSG AQUACEL AG ADV 3.5X10 (GAUZE/BANDAGES/DRESSINGS) IMPLANT
DRSG MEPILEX POST OP 4X8 (GAUZE/BANDAGES/DRESSINGS) ×2 IMPLANT
ELECT REM PT RETURN 9FT ADLT (ELECTROSURGICAL) ×1
ELECTRODE REM PT RTRN 9FT ADLT (ELECTROSURGICAL) ×1 IMPLANT
GLOVE BIOGEL PI IND STRL 8 (GLOVE) ×2 IMPLANT
GLOVE ORTHO TXT STRL SZ7.5 (GLOVE) IMPLANT
GOWN STRL REUS W/ TWL XL LVL3 (GOWN DISPOSABLE) ×1 IMPLANT
GOWN STRL REUS W/TWL XL LVL3 (GOWN DISPOSABLE) ×1
GOWN STRL SURGICAL XL XLNG (GOWN DISPOSABLE) ×1 IMPLANT
GUIDEWIRE BALL NOSE 3.0X900 (WIRE) ×1
GUIDEWIRE ORTH 900X3XBALL NOSE (WIRE) IMPLANT
KIT BASIN OR (CUSTOM PROCEDURE TRAY) ×1 IMPLANT
NAIL RIGHT NAIL 10X39X125 ES (Nail) IMPLANT
NS IRRIG 1000ML POUR BTL (IV SOLUTION) ×1 IMPLANT
PACK GENERAL/GYN (CUSTOM PROCEDURE TRAY) ×1 IMPLANT
PAD ARMBOARD 7.5X6 YLW CONV (MISCELLANEOUS) ×2 IMPLANT
PIN GUIDE THRD AR 3.2X330 (PIN) IMPLANT
SCREW LOCK CORT 5.0X38 (Screw) IMPLANT
SCREW LOCK CORT 5.0X40 (Screw) IMPLANT
SCREW SOLID LOCK LAG 10.5X80 (Screw) IMPLANT
STAPLER VISISTAT 35W (STAPLE) IMPLANT
SUT MNCRL AB 3-0 PS2 18 (SUTURE) ×2 IMPLANT
SUT MON AB 2-0 CT1 36 (SUTURE) ×2 IMPLANT
SUT PDS AB 1 CT  36 (SUTURE)
SUT PDS AB 1 CT 36 (SUTURE) ×2 IMPLANT
SUT VIC AB 0 CT1 27 (SUTURE) ×3
SUT VIC AB 0 CT1 27XBRD ANBCTR (SUTURE) ×2 IMPLANT
TOWEL GREEN STERILE (TOWEL DISPOSABLE) ×2 IMPLANT
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
## 2023-03-13 NOTE — Progress Notes (Signed)
Orthopedic Tech Progress Note Patient Details:  Laurie Mitchell [birth year] [id number]      Post Interventions Patient Tolerated: Poor Instructions Provided: Poper ambulation with device  Musculoskeletal Traction Type of Traction: Bucks Skin Traction Traction Location: RLE Traction Weight: 15 lbs   Post Interventions Patient Tolerated: Poor Instructions Provided: Poper ambulation with device  [name] 03/13/2023, 9:41 AM
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
## 2023-03-13 NOTE — Anesthesia Preprocedure Evaluation (Addendum)
Anesthesia Evaluation  Patient identified by MRN, date of birth, ID band Patient awake    Reviewed: Allergy & Precautions, NPO status , Patient's Chart, lab work & pertinent test results  Airway Mallampati: II  TM Distance: >3 FB Neck ROM: Full    Dental  (+) Teeth Intact, Dental Advisory Given   Pulmonary neg pulmonary ROS   Pulmonary exam normal breath sounds clear to auscultation       Cardiovascular negative cardio ROS Normal cardiovascular exam Rhythm:Regular Rate:Normal     Neuro/Psych negative neurological ROS  negative psych ROS   GI/Hepatic negative GI ROS, Neg liver ROS,,,  Endo/Other  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  Reproductive/Obstetrics                             Anesthesia Physical Anesthesia Plan  ASA: 2 and emergent  Anesthesia Plan: General   Post-op Pain Management: Ofirmev IV (intra-op)* and Toradol IV (intra-op)*   Induction: Intravenous  PONV Risk Score and Plan: 3 and Midazolam, Dexamethasone and Ondansetron  Airway Management Planned: Oral ETT  Additional Equipment:   Intra-op Plan:   Post-operative Plan: Extubation in OR  Informed Consent: I have reviewed the patients History and Physical, chart, labs and discussed the procedure including the risks, benefits and alternatives for the proposed anesthesia with the patient or authorized representative who has indicated his/her understanding and acceptance.     Dental advisory given  Plan Discussed with: CRNA  Anesthesia Plan Comments:        Anesthesia Quick Evaluation
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
## 2023-03-13 NOTE — Transfer of Care (Signed)
Immediate Anesthesia Transfer of Care Note  Patient: Laurie Mitchell  Procedure(s) Performed: INTRAMEDULLARY (IM) NAIL FEMORAL (Right)  Patient Location: PACU  Anesthesia Type:General  Level of Consciousness: drowsy  Airway & Oxygen Therapy: Patient Spontanous Breathing  Post-op Assessment: Report given to RN and Post -op Vital signs reviewed and stable  Post vital signs: Reviewed and stable  Last Vitals:  Vitals Value Taken Time  BP 139/81 03/13/23 1741  Temp    Pulse 90 03/13/23 1743  Resp 16 03/13/23 1743  SpO2 94 % 03/13/23 1743  Vitals shown include unvalidated device data.  Last Pain:  Vitals:   03/13/23 1254  TempSrc: Oral  PainSc: 5       Patients Stated Pain Goal: 2 ([id number])  Complications: No notable events documented.
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
## 2023-03-13 NOTE — Care Plan (Signed)
Patient discussed with EDP.  Imaging reviewed.  R femur fracture.  Pending additional workup.  NWB RLE Bucks traction RLE 15 lbs NPO for OR

## 2023-03-13 NOTE — Anesthesia Procedure Notes (Signed)
Procedure Name: Intubation Date/Time: 03/13/2023 3:17 PM  Performed by: Clovis Cao, CRNAPre-anesthesia Checklist: Patient identified, Emergency Drugs available, Suction available and Patient being monitored Patient Re-evaluated:Patient Re-evaluated prior to induction Oxygen Delivery Method: Circle system utilized Preoxygenation: Pre-oxygenation with 100% oxygen Induction Type: IV induction Ventilation: Mask ventilation without difficulty Laryngoscope Size: Stowers and 2 Grade View: Grade I Tube type: Oral Tube size: 7.0 mm Number of attempts: 1 Airway Equipment and Method: Stylet and Oral airway Placement Confirmation: ETT inserted through vocal cords under direct vision, positive ETCO2 and breath sounds checked- equal and bilateral Secured at: 21 cm Tube secured with: Tape Dental Injury: Teeth and Oropharynx as per pre-operative assessment

## 2023-03-13 NOTE — Discharge Instructions (Signed)
Discharge instructions for Dr. Georgeanna Harrison, M.D., Orthopaedic Surgeon, Laurie Mitchell:  Diet: As you were doing prior to hospitalization, unless instructed otherwise by medical team, dietary/nutrition team, etc. Dressing:  Keep AQUACEL on and dry for up to 14 days.  Do not allow surgical area to get wet before that.  Remove AQUACEL dressing after 14 days and allow area to get wet in shower but DO NOT SUBMERGE until wound is evaluated in clinic.  In most cases skin glue is used and no additional dressing is necessary.  Shower:  Unless otherwise specified, may shower but keep the wounds dry, use an occlusive plastic wrap, NO SOAKING IN TUB.  If the bandage gets wet, change with a clean dry gauze.  Unless otherwise specified, after 5 days dressing(s) should be removed (7 days for PREVENA dressings) and wound(s) may get wet in the shower by allowing water to gently run over.  Again, no soaking in tub, and do NOT submerge for at least 4 weeks!!! Activity:  Increase activity slowly as tolerated.  If you right leg is injured or immobilized, no driving for 6 weeks or until discussed with your surgeon.  If you have an injury or immobilization of the left lower extremity you may not operate a clutch. Please note that driving with any kind of immobilization for the upper extremity (sling, shoulder brace, splint, cast, etc.) may also be considered impaired driving and should not be attempted. Weight Bearing: WEIGHTBEARING AS TOLERATED (WBAT) on the RIGHT LOWER EXTREMITY. To prevent constipation: You may use over-the-counter stool softener(s) such as Colace (over the counter) 100 mg by mouth twice a day and/or Miralax (over the counter) for constipation as needed.  Drink plenty of fluids (prune juice may be helpful) and high fiber foods.  Itching:  If you experience itching with your medications, try taking only a single pain pill, or even half a pain pill at a time.  You can also use  benadryl over the counter for itching or also to help with sleep.  Precautions:  If you experience chest pain or shortness of breath - call 911 immediately for transfer to the hospital emergency department!! Medications: Please contact the clinic during office hours (Monday through Friday, 0800 to 1600) if you need a refill on any medications.  Please monitor medications and allow 24 to 48 hours to process refill request!!!!  Please note that only medications directly related to the surgery can be prescribed.  For other medications (e.g. blood pressure medicines, sleeping medicines, etc.), please contact the prescribing physician or your primary care provider. DVT Prophylaxis: Lovenox 40 mg daily for six weeks postoperatively  If you develop a fever greater that 101.1 deg F, purulent drainage from wound, increased redness or drainage from wound, or calf pain -- Call the office at [phone].
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
## 2023-03-14 DIAGNOSIS — S7291XA Unspecified fracture of right femur, initial encounter for closed fracture: Secondary | ICD-10-CM | POA: Diagnosis not present

## 2023-03-14 LAB — CBC
HCT: 32.2 % — ABNORMAL LOW (ref 36.0–46.0)
Hemoglobin: 10.9 g/dL — ABNORMAL LOW (ref 12.0–15.0)
MCH: 30.3 pg (ref 26.0–34.0)
MCHC: 33.9 g/dL (ref 30.0–36.0)
MCV: 89.4 fL (ref 80.0–100.0)
Platelets: 228 10*3/uL (ref 150–400)
RBC: 3.6 MIL/uL — ABNORMAL LOW (ref 3.87–5.11)
RDW: 13.3 % (ref 11.5–15.5)
WBC: 9.5 10*3/uL (ref 4.0–10.5)
nRBC: 0 % (ref 0.0–0.2)

## 2023-03-14 LAB — GLUCOSE, CAPILLARY
Glucose-Capillary: 158 mg/dL — ABNORMAL HIGH (ref 70–99)
Glucose-Capillary: 199 mg/dL — ABNORMAL HIGH (ref 70–99)
Glucose-Capillary: 203 mg/dL — ABNORMAL HIGH (ref 70–99)
Glucose-Capillary: 214 mg/dL — ABNORMAL HIGH (ref 70–99)
Glucose-Capillary: 215 mg/dL — ABNORMAL HIGH (ref 70–99)
Glucose-Capillary: 224 mg/dL — ABNORMAL HIGH (ref 70–99)

## 2023-03-14 LAB — BASIC METABOLIC PANEL
Anion gap: 9 (ref 5–15)
BUN: 11 mg/dL (ref 6–20)
CO2: 23 mmol/L (ref 22–32)
Calcium: 8.4 mg/dL — ABNORMAL LOW (ref 8.9–10.3)
Chloride: 102 mmol/L (ref 98–111)
Creatinine, Ser: 0.81 mg/dL (ref 0.44–1.00)
GFR, Estimated: >60 mL/min (ref >60)
Glucose, Bld: 214 mg/dL — ABNORMAL HIGH (ref 70–99)
Potassium: 3.3 mmol/L — ABNORMAL LOW (ref 3.5–5.1)
Sodium: 134 mmol/L — ABNORMAL LOW (ref 135–145)

## 2023-03-14 LAB — MAGNESIUM: Magnesium: 1.9 mg/dL (ref 1.7–2.4)

## 2023-03-14 MED ORDER — METFORMIN HCL 500 MG PO TABS
500.0000 mg | ORAL_TABLET | Freq: Two times a day (BID) | ORAL | Status: DC
  Administered 2023-03-14 – 2023-03-17 (×6): 500 mg via ORAL
  Filled 2023-03-14 (×6): qty 1

## 2023-03-14 MED ORDER — GLIPIZIDE 5 MG PO TABS
5.0000 mg | ORAL_TABLET | Freq: Every day | ORAL | Status: DC
  Administered 2023-03-15: 5 mg via ORAL
  Filled 2023-03-14: qty 1

## 2023-03-14 MED ORDER — INSULIN ASPART 100 UNIT/ML IJ SOLN
0.0000 [IU] | Freq: Three times a day (TID) | INTRAMUSCULAR | Status: DC
  Administered 2023-03-14: 3 [IU] via SUBCUTANEOUS
  Administered 2023-03-14: 2 [IU] via SUBCUTANEOUS
  Administered 2023-03-15: 5 [IU] via SUBCUTANEOUS
  Administered 2023-03-15: 2 [IU] via SUBCUTANEOUS
  Administered 2023-03-15: 3 [IU] via SUBCUTANEOUS
  Administered 2023-03-16: 1 [IU] via SUBCUTANEOUS
  Administered 2023-03-16 – 2023-03-17 (×4): 2 [IU] via SUBCUTANEOUS

## 2023-03-14 MED ORDER — POTASSIUM CHLORIDE CRYS ER 20 MEQ PO TBCR
40.0000 meq | EXTENDED_RELEASE_TABLET | Freq: Once | ORAL | Status: AC
  Administered 2023-03-14: 40 meq via ORAL
  Filled 2023-03-14: qty 2

## 2023-03-14 MED ORDER — LIP MEDEX EX OINT
TOPICAL_OINTMENT | CUTANEOUS | Status: DC | PRN
  Filled 2023-03-14: qty 7

## 2023-03-14 MED ORDER — LIVING WELL WITH DIABETES BOOK
Freq: Once | Status: AC
  Filled 2023-03-14: qty 1
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
## 2023-03-14 NOTE — Progress Notes (Signed)
Orthopaedics Daily Progress Note   03/14/2023   11:19 AM  Laurie Mitchell is a 52 y.o. female 1 Day Post-Op s/p INTRAMEDULLARY (IM) NAIL FEMORAL  Subjective Pain well controlled.  Denies nausea, vomiting, or fevers. Awaiting PT.  Objective Vitals:   03/14/23 0420 03/14/23 0922  BP: 108/73 (!) 154/83  Pulse: 91 87  Resp: 10   Temp: 98.1 F (36.7 C) 98.4 F (36.9 C)  SpO2: 97% 98%    Intake/Output Summary (Last 24 hours) at 03/14/2023 1119 Last data filed at 03/14/2023 0423 Gross per 24 hour  Intake 1700 ml  Output 700 ml  Net 1000 ml    Physical Exam RLE: Dressing clean, dry, and intact +DF/PF/EHL SILT SP/DP/T +DP/PT and WWP distally  Assessment 52 y.o. female s/p Procedure(s) (LRB): INTRAMEDULLARY (IM) NAIL FEMORAL (Right)  Plan POSTOPERATIVE INSTRUCTIONS: Mobility: Out of bed with PT/OT Pain control: Continue to wean/titrate to appropriate oral regimen DVT Prophylaxis: Lovenox 40 mg daily x 6 weeks postoperatively Further surgical plans: None RLE: Weightbearing as tolerated, no restrictions Dressing care: Keep AQUACEL on and dry for up to 14 days.  Do not allow surgical area to get wet before that.  Remove AQUACEL dressing after 14 days and allow area to get wet in shower but DO NOT SUBMERGE until wound is evaluated in clinic.  In most cases skin glue is used and no additional dressing is necessary.  Disposition: Per primary team as medically appropriate Follow-up: Please call [facility] [phone]) to schedule follow-op appointment for 2 weeks after surgery.  I have verified that my discharge instructions and follow-up information have been entered in the Discharge Navigator in Epic.  These should automatically populate in the AVS.  Please print the AVS in its entirety and ensure that the patient or a responsible party has a complete copy of the AVS before they are discharged.  If there are questions regarding discharge instructions  or follow-up before the AVS is generated, please check the Discharge Navigator before attempting to contact the surgeon/office.  If unsure how to access the Discharge Navigator or the information contained in the Discharge Navigator, or how to generate/print the AVS, please contact the appropriate Nurse, learning disability.   The patient's postoperative prescriptions (e.g. pain medication, anticoagulation) have been printed, signed, and placed in/on the patient's hard chart: Lovenox and Oxycodone    Georgeanna Harrison M.D. Orthopaedic Surgery [facility]
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
## 2023-03-15 DIAGNOSIS — S7291XA Unspecified fracture of right femur, initial encounter for closed fracture: Secondary | ICD-10-CM | POA: Diagnosis not present

## 2023-03-15 LAB — CBC
HCT: 31.7 % — ABNORMAL LOW (ref 36.0–46.0)
Hemoglobin: 10.7 g/dL — ABNORMAL LOW (ref 12.0–15.0)
MCH: 30.1 pg (ref 26.0–34.0)
MCHC: 33.8 g/dL (ref 30.0–36.0)
MCV: 89 fL (ref 80.0–100.0)
Platelets: 188 10*3/uL (ref 150–400)
RBC: 3.56 MIL/uL — ABNORMAL LOW (ref 3.87–5.11)
RDW: 13.3 % (ref 11.5–15.5)
WBC: 8.5 10*3/uL (ref 4.0–10.5)
nRBC: 0 % (ref 0.0–0.2)

## 2023-03-15 LAB — BASIC METABOLIC PANEL
Anion gap: 8 (ref 5–15)
BUN: 7 mg/dL (ref 6–20)
CO2: 24 mmol/L (ref 22–32)
Calcium: 8.2 mg/dL — ABNORMAL LOW (ref 8.9–10.3)
Chloride: 103 mmol/L (ref 98–111)
Creatinine, Ser: 0.66 mg/dL (ref 0.44–1.00)
GFR, Estimated: >60 mL/min (ref >60)
Glucose, Bld: 177 mg/dL — ABNORMAL HIGH (ref 70–99)
Potassium: 3.3 mmol/L — ABNORMAL LOW (ref 3.5–5.1)
Sodium: 135 mmol/L (ref 135–145)

## 2023-03-15 LAB — HEMOGLOBIN A1C
Hgb A1c MFr Bld: 10.2 % — ABNORMAL HIGH (ref 4.8–5.6)
Hgb A1c MFr Bld: 10.3 % — ABNORMAL HIGH (ref 4.8–5.6)
Mean Plasma Glucose: 246 mg/dL
Mean Plasma Glucose: 249 mg/dL

## 2023-03-15 LAB — GLUCOSE, CAPILLARY
Glucose-Capillary: 281 mg/dL — ABNORMAL HIGH (ref 70–99)
Glucose-Capillary: 247 mg/dL — ABNORMAL HIGH (ref 70–99)
Glucose-Capillary: 197 mg/dL — ABNORMAL HIGH (ref 70–99)
Glucose-Capillary: 153 mg/dL — ABNORMAL HIGH (ref 70–99)

## 2023-03-15 MED ORDER — INSULIN GLARGINE-YFGN 100 UNIT/ML ~~LOC~~ SOLN
12.0000 [IU] | Freq: Every day | SUBCUTANEOUS | Status: DC
  Administered 2023-03-15 – 2023-03-16 (×2): 12 [IU] via SUBCUTANEOUS
  Filled 2023-03-15 (×3): qty 0.12

## 2023-03-15 MED ORDER — MUPIROCIN 2 % EX OINT
1.0000 | TOPICAL_OINTMENT | Freq: Two times a day (BID) | CUTANEOUS | Status: DC
  Administered 2023-03-16 – 2023-03-17 (×3): 1 via NASAL
  Filled 2023-03-15: qty 22

## 2023-03-15 MED ORDER — LIVING WELL WITH DIABETES BOOK
Freq: Once | Status: AC
  Filled 2023-03-15: qty 1

## 2023-03-15 MED ORDER — METHOCARBAMOL 750 MG PO TABS
750.0000 mg | ORAL_TABLET | Freq: Once | ORAL | Status: AC
  Administered 2023-03-15: 750 mg via ORAL
  Filled 2023-03-15: qty 1

## 2023-03-15 MED ORDER — METHOCARBAMOL 500 MG PO TABS
500.0000 mg | ORAL_TABLET | Freq: Three times a day (TID) | ORAL | Status: DC | PRN
  Administered 2023-03-16 – 2023-03-17 (×2): 500 mg via ORAL
  Filled 2023-03-15 (×3): qty 1

## 2023-03-15 MED ORDER — GLIPIZIDE 5 MG PO TABS: 10.0000 mg | ORAL_TABLET | Freq: Every day | ORAL | Status: DC

## 2023-03-15 MED ORDER — POTASSIUM CHLORIDE CRYS ER 20 MEQ PO TBCR
40.0000 meq | EXTENDED_RELEASE_TABLET | Freq: Once | ORAL | Status: AC
  Administered 2023-03-15: 40 meq via ORAL
  Filled 2023-03-15: qty 2

## 2023-03-15 MED ORDER — SODIUM CHLORIDE 0.9 % IV BOLUS
1000.0000 mL | Freq: Once | INTRAVENOUS | Status: AC
  Administered 2023-03-15: 1000 mL via INTRAVENOUS

## 2023-03-15 NOTE — Plan of Care (Signed)
Nutrition Quick Note:   RD consulted for nutrition education regarding diabetes.   Lab Results  Component Value Date   HGBA1C 10.2 (H) 03/13/2023    RD provided "Carbohydrate Counting for People with Diabetes" handout from the Academy of Nutrition and Dietetics. Discussed different food groups and their effects on blood sugar, emphasizing carbohydrate-containing foods. Provided list of carbohydrates and recommended serving sizes of common foods.  Discussed importance of controlled and consistent carbohydrate intake throughout the day. Provided examples of ways to balance meals/snacks and encouraged intake of high-fiber, whole grain complex carbohydrates. Teach back method used.  Expect fair compliance.  Body mass index is 28.89 kg/m. Pt meets criteria for overweight based on current BMI.  Labs and medications reviewed. No further nutrition interventions warranted at this time. RD contact information provided. If additional nutrition issues arise, please re-consult RD.  Thalia Bloodgood, RD, LDN, CNSC.
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
## 2023-03-15 NOTE — Progress Notes (Signed)
Orthopaedics Daily Progress Note   03/15/2023   6:35 AM  Laurie Mitchell is a 52 y.o. female 2 Days Post-Op s/p INTRAMEDULLARY (IM) NAIL FEMORAL  Subjective Pain well controlled.  Denies nausea, vomiting, or fevers.  Objective Vitals:   03/14/23 1440 03/15/23 0459  BP: (!) 133/91 (!) 143/85  Pulse: 92 (!) 116  Resp:  16  Temp: 98.8 F (37.1 C) 100.2 F (37.9 C)  SpO2: 93% 96%    Intake/Output Summary (Last 24 hours) at 03/15/2023 [id number] Last data filed at 03/15/2023 0440 Gross per 24 hour  Intake --  Output 600 ml  Net -600 ml     Physical Exam RLE: Dressing clean, dry, and intact +DF/PF/EHL SILT SP/DP/T +DP/PT and WWP distally  Assessment 52 y.o. female s/p Procedure(s) (LRB): INTRAMEDULLARY (IM) NAIL FEMORAL (Right)  Plan POSTOPERATIVE INSTRUCTIONS: Mobility: Out of bed with PT/OT Pain control: Continue to wean/titrate to appropriate oral regimen DVT Prophylaxis: Lovenox 40 mg daily x 6 weeks postoperatively Further surgical plans: None RLE: Weightbearing as tolerated, no restrictions Dressing care: Keep AQUACEL on and dry for up to 14 days.  Do not allow surgical area to get wet before that.  Remove AQUACEL dressing after 14 days and allow area to get wet in shower but DO NOT SUBMERGE until wound is evaluated in clinic.  In most cases skin glue is used and no additional dressing is necessary.  Disposition: Per primary team as medically appropriate Follow-up: Please call [place] [phone]) to schedule follow-op appointment for 2 weeks after surgery.  I have verified that my discharge instructions and follow-up information have been entered in the Discharge Navigator in Epic.  These should automatically populate in the AVS.  Please print the AVS in its entirety and ensure that the patient or a responsible party has a complete copy of the AVS before they are discharged.  If there are questions regarding discharge instructions or  follow-up before the AVS is generated, please check the Discharge Navigator before attempting to contact the surgeon/office.  If unsure how to access the Discharge Navigator or the information contained in the Discharge Navigator, or how to generate/print the AVS, please contact the appropriate Nurse, learning disability.   The patient's postoperative prescriptions (e.g. pain medication, anticoagulation) have been printed, signed, and placed in/on the patient's hard chart: Lovenox and Oxycodone    Georgeanna Harrison M.D. Orthopaedic Surgery [facility]
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
## 2023-03-15 NOTE — TOC CAGE-AID Note (Signed)
Transition of Care [facility]) - CAGE-AID Screening   Patient Details  Name: Laurie Mitchell MRN: [id number] Date of Birth: [birth year]  Transition of Care [facility]) CM/SW Contact:    Trudee Kuster, RN Phone Number: 03/15/2023, 1:58 AM   Clinical Narrative: Denies use of alcohol, denies use of illicit drugs. Education not offered at this time.   CAGE-AID Screening:    Have You Ever Felt You Ought to Cut Down on Your Drinking or Drug Use?: No Have People Annoyed You By Critizing Your Drinking Or Drug Use?: No Have You Felt Bad Or Guilty About Your Drinking Or Drug Use?: No Have You Ever Had a Drink or Used Drugs First Thing In The Morning to Steady Your Nerves or to Get Rid of a Hangover?: No CAGE-AID Score: 0  Substance Abuse Education Offered: No

## 2023-03-15 NOTE — Inpatient Diabetes Management (Signed)
Inpatient Diabetes Program Recommendations  AACE/ADA: New Consensus Statement on Inpatient Glycemic Control   Target Ranges:  Prepandial:   less than 140 mg/dL      Peak postprandial:   less than 180 mg/dL (1-2 hours)      Critically ill patients:  140 - 180 mg/dL     Review of Glycemic Control  Diabetes history:  NO Outpatient Diabetes medications: NA Current orders for Inpatient glycemic control:  Novolog 0-9 units Q4H Glipizide 10 mg Daily Metformin 500 mg bid   Glucerna BID between meals HbgA1C:  10.2% on 3/16  Spoke with daughter at bedside. Pt was very drowsy due to pain medication administration. Spoke with daughter about new diagnosis. Reviewed current glucose trends and inpatient medication regimen to control glucose levels. I explained the importance of glucose control for wound healing. Based off of this information I expressed concerns about pt glucose trends being elevated on the oral medications and introduced the idea of a daily injection of basal insulin at time of d/c. Pt daughter stated pt would do what was needed even though she had told medical staff that she wanted to control glucose levels through natural means.   Will speak with pt tomorrow early around 9 am to speak more about insulin and possibly review how to administer insulin. Ordered with living well with diabetes booklet for diabetes education.   Inpatient Diabetes Program Recommendations:   Based on concerns of wound healing and elevated glucose trends on oral meds alone :  -   Consider restarting Semglee insulin  Thanks,  Tama Headings RN, MSN, BC-ADM Inpatient Diabetes Coordinator Team Pager [phone] (8a-5p)
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
## 2023-03-16 ENCOUNTER — Other Ambulatory Visit (HOSPITAL_COMMUNITY): Payer: Self-pay

## 2023-03-16 DIAGNOSIS — S7291XA Unspecified fracture of right femur, initial encounter for closed fracture: Secondary | ICD-10-CM | POA: Diagnosis not present

## 2023-03-16 LAB — BASIC METABOLIC PANEL
Anion gap: 6 (ref 5–15)
BUN: <5 mg/dL — ABNORMAL LOW (ref 6–20)
CO2: 27 mmol/L (ref 22–32)
Calcium: 8.3 mg/dL — ABNORMAL LOW (ref 8.9–10.3)
Chloride: 104 mmol/L (ref 98–111)
Creatinine, Ser: 0.57 mg/dL (ref 0.44–1.00)
GFR, Estimated: >60 mL/min (ref >60)
Glucose, Bld: 108 mg/dL — ABNORMAL HIGH (ref 70–99)
Potassium: 3.6 mmol/L (ref 3.5–5.1)
Sodium: 137 mmol/L (ref 135–145)

## 2023-03-16 LAB — GLUCOSE, CAPILLARY
Glucose-Capillary: 145 mg/dL — ABNORMAL HIGH (ref 70–99)
Glucose-Capillary: 159 mg/dL — ABNORMAL HIGH (ref 70–99)
Glucose-Capillary: 163 mg/dL — ABNORMAL HIGH (ref 70–99)
Glucose-Capillary: 170 mg/dL — ABNORMAL HIGH (ref 70–99)

## 2023-03-16 LAB — CBC
HCT: 29.4 % — ABNORMAL LOW (ref 36.0–46.0)
Hemoglobin: 10 g/dL — ABNORMAL LOW (ref 12.0–15.0)
MCH: 29.9 pg (ref 26.0–34.0)
MCHC: 34 g/dL (ref 30.0–36.0)
MCV: 88 fL (ref 80.0–100.0)
Platelets: 200 10*3/uL (ref 150–400)
RBC: 3.34 MIL/uL — ABNORMAL LOW (ref 3.87–5.11)
RDW: 13 % (ref 11.5–15.5)
WBC: 7.9 10*3/uL (ref 4.0–10.5)
nRBC: 0 % (ref 0.0–0.2)

## 2023-03-16 MED ORDER — LANCET DEVICE MISC
1.0000 | Freq: Three times a day (TID) | 0 refills | Status: AC
  Filled 2023-03-16: qty 1, 30d supply, fill #0

## 2023-03-16 MED ORDER — INSULIN DETEMIR 100 UNIT/ML FLEXPEN
12.0000 [IU] | PEN_INJECTOR | Freq: Every day | SUBCUTANEOUS | 0 refills | Status: AC
  Filled 2023-03-16 – 2023-04-06 (×3): qty 3, 25d supply, fill #0

## 2023-03-16 MED ORDER — ACCU-CHEK SOFTCLIX LANCETS MISC
1.0000 | Freq: Three times a day (TID) | 0 refills | Status: AC
  Filled 2023-03-16: qty 100, 30d supply, fill #0

## 2023-03-16 MED ORDER — METFORMIN HCL 500 MG PO TABS
500.0000 mg | ORAL_TABLET | Freq: Two times a day (BID) | ORAL | 0 refills | Status: AC
  Filled 2023-03-16: qty 60, 30d supply, fill #0

## 2023-03-16 MED ORDER — SENNOSIDES-DOCUSATE SODIUM 8.6-50 MG PO TABS
1.0000 | ORAL_TABLET | Freq: Every day | ORAL | 0 refills | Status: AC
  Filled 2023-03-16: qty 14, 14d supply, fill #0

## 2023-03-16 MED ORDER — INSULIN PEN NEEDLE 32G X 4 MM MISC
1.0000 | Freq: Every day | 0 refills | Status: AC
  Filled 2023-03-16: qty 100, 30d supply, fill #0

## 2023-03-16 MED ORDER — METHOCARBAMOL 500 MG PO TABS
500.0000 mg | ORAL_TABLET | Freq: Three times a day (TID) | ORAL | 0 refills | Status: AC | PRN
  Filled 2023-03-16: qty 45, 15d supply, fill #0

## 2023-03-16 MED ORDER — ACCU-CHEK GUIDE ME W/DEVICE KIT
1.0000 | PACK | Freq: Three times a day (TID) | 0 refills | Status: AC
  Filled 2023-03-16: qty 1, 30d supply, fill #0

## 2023-03-16 MED ORDER — BLOOD GLUCOSE TEST VI STRP
1.0000 | ORAL_STRIP | Freq: Three times a day (TID) | 0 refills | Status: AC
  Filled 2023-03-16: qty 100, 30d supply, fill #0

## 2023-03-16 NOTE — Plan of Care (Signed)
°  Problem: Clinical Measurements: °Goal: Will remain free from infection °Outcome: Progressing °  °Problem: Activity: °Goal: Risk for activity intolerance will decrease °Outcome: Progressing °  °Problem: Nutrition: °Goal: Adequate nutrition will be maintained °Outcome: Progressing °  °
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
## 2023-03-16 NOTE — Inpatient Diabetes Management (Signed)
Inpatient Diabetes Program Recommendations  AACE/ADA: New Consensus Statement on Inpatient Glycemic Control   Target Ranges:  Prepandial:   less than 140 mg/dL      Peak postprandial:   less than 180 mg/dL (1-2 hours)      Critically ill patients:  140 - 180 mg/dL     Review of Glycemic Control  Latest Reference Range & Units 03/15/23 07:28 03/15/23 11:30 03/15/23 16:50 03/15/23 21:03 03/16/23 07:36  Glucose-Capillary 70 - 99 mg/dL 281 (H) 247 (H) 197 (H) 153 (H) 145 (H)   Diabetes history:  New diagnosis this admission  Current orders for Inpatient glycemic control:  Novolog 0-9 units tid Metformin 500 mg bid Semglee 12 units qhs  Glucerna BID between meals HbgA1C:  10.2% on 3/16  Spoke with daughter and pt at bedside. Pt more alert this am. We discussed insulin. Pt is agreeable to using insulin at this time to promote wound healing and until she can increase her physical activity. Pt reports her husband has diabetes and was on an insulin pen. Demonstrated the use of insulin pen to pt. Will attach QR video to pt AVS. Will review with pt closer to d/c.   Inpatient Diabetes Program Recommendations:    Fasting glucose better at 145 this am. Watch on current regimen with Semglee dose.   Do not see insurance coverage. Daughter mentioned yesterday that they will find a PCP for pt to follow up with.  Thanks,  Tama Headings RN, MSN, BC-ADM Inpatient Diabetes Coordinator Team Pager [phone] (8a-5p)

## 2023-03-16 NOTE — Progress Notes (Signed)
Physical Therapy Note  At current functional level, will need to consider dc home at mostly wheelchair-transfer level; Pt also has a flight of steps to access bedroom and full bathroom;  She is anticipating getting home and staying on main level in her home (on/of couch will be quite difficult at this time);  Will update equipment recs to include wheelchair and hospital bed;     03/16/23 0800   PT - Assessment/Plan  PT equipment Rolling walker (2 wheels);BSC/3in1;Wheelchair (measurements PT);Wheelchair cushion (measurements PT);Hospital bed   Will continue to follow and update status and recs as needed;   Roney Marion, [place]
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
## 2023-03-17 ENCOUNTER — Encounter (HOSPITAL_COMMUNITY): Payer: Self-pay | Admitting: Orthopedic Surgery

## 2023-03-17 ENCOUNTER — Other Ambulatory Visit (HOSPITAL_COMMUNITY): Payer: Self-pay

## 2023-03-17 DIAGNOSIS — S7291XA Unspecified fracture of right femur, initial encounter for closed fracture: Secondary | ICD-10-CM | POA: Diagnosis not present

## 2023-03-17 LAB — CBC
HCT: 29.3 % — ABNORMAL LOW (ref 36.0–46.0)
Hemoglobin: 10 g/dL — ABNORMAL LOW (ref 12.0–15.0)
MCH: 30.4 pg (ref 26.0–34.0)
MCHC: 34.1 g/dL (ref 30.0–36.0)
MCV: 89.1 fL (ref 80.0–100.0)
Platelets: 234 10*3/uL (ref 150–400)
RBC: 3.29 MIL/uL — ABNORMAL LOW (ref 3.87–5.11)
RDW: 13 % (ref 11.5–15.5)
WBC: 7.8 10*3/uL (ref 4.0–10.5)
nRBC: 0 % (ref 0.0–0.2)

## 2023-03-17 LAB — BASIC METABOLIC PANEL
Anion gap: 12 (ref 5–15)
BUN: <5 mg/dL — ABNORMAL LOW (ref 6–20)
CO2: 29 mmol/L (ref 22–32)
Calcium: 8.6 mg/dL — ABNORMAL LOW (ref 8.9–10.3)
Chloride: 96 mmol/L — ABNORMAL LOW (ref 98–111)
Creatinine, Ser: 0.69 mg/dL (ref 0.44–1.00)
GFR, Estimated: >60 mL/min (ref >60)
Glucose, Bld: 144 mg/dL — ABNORMAL HIGH (ref 70–99)
Potassium: 3.8 mmol/L (ref 3.5–5.1)
Sodium: 137 mmol/L (ref 135–145)

## 2023-03-17 LAB — GLUCOSE, CAPILLARY: Glucose-Capillary: 181 mg/dL — ABNORMAL HIGH (ref 70–99)

## 2023-03-17 MED ORDER — OXYCODONE HCL 5 MG PO TABS
5.0000 mg | ORAL_TABLET | ORAL | 0 refills | Status: DC
  Filled 2023-03-17: qty 40, 7d supply, fill #0

## 2023-03-17 MED ORDER — ENOXAPARIN SODIUM 40 MG/0.4ML IJ SOSY
40.0000 mg | PREFILLED_SYRINGE | Freq: Every day | INTRAMUSCULAR | 0 refills | Status: AC
  Filled 2023-03-17: qty 12, 30d supply, fill #0

## 2023-03-17 NOTE — Progress Notes (Signed)
Discharge instruction given. Patient verbalized understanding and all questions were answered.  
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
## 2023-03-17 NOTE — TOC Transition Note (Addendum)
Transition of Care [facility]) - CM/SW Discharge Note   Patient Details  Name: Laurie Mitchell MRN: [id number] Date of Birth: 1971-08-14  Transition of Care [facility] CM/SW Contact:  Sharin Mons, RN Phone Number: 03/17/2023, 11:45 AM   Clinical Narrative:    Patient will DC to: home Anticipated DC date: 03/17/2023 Family notified: yes Transport by: car   Per MD patient ready for DC today. RN, patient, and patient's husband aware of DC. Pt agreeable to outpatient PT/OT services. Outpatient referral made with [facility].( faxed to [phone]) and noted on AVS.  Post hospital f/u noted on AVS.  Pt without RX med concerns.  Husband to provide transportation to home.  RNCM will sign off for now as intervention is no longer needed. Please consult [place] again if new needs arise.   Final next level of care: OP Rehab Barriers to Discharge: Continued Medical Work up   Patient Goals and CMS Choice   Choice offered to / list presented to : Patient  Discharge Placement                         Discharge Plan and Services Additional resources added to the After Visit Summary for                  DME Arranged: Walker rolling, Bedside commode DME Agency: [employer] Date DME Agency Contacted: 03/15/23 Time DME Agency Contacted: [phone] Representative spoke with at DME Agency: Erasmo Downer ( text)            Social Determinants of Health ([place]) Interventions SDOH Screenings   Food Insecurity: No Food Insecurity (03/13/2023)  Housing: [place]  (03/13/2023)  Transportation Needs: No Transportation Needs (03/13/2023)  Utilities: Not At Risk (03/13/2023)  Tobacco Use: Unknown (03/13/2023)     Readmission Risk Interventions     No data to display

## 2023-03-29 ENCOUNTER — Other Ambulatory Visit (HOSPITAL_COMMUNITY): Payer: Self-pay

## 2023-04-03 ENCOUNTER — Other Ambulatory Visit (HOSPITAL_COMMUNITY): Payer: Self-pay

## 2023-04-05 ENCOUNTER — Other Ambulatory Visit (HOSPITAL_COMMUNITY): Payer: Self-pay

## 2023-04-06 ENCOUNTER — Other Ambulatory Visit (HOSPITAL_COMMUNITY): Payer: Self-pay

## 2023-04-08 ENCOUNTER — Other Ambulatory Visit (HOSPITAL_COMMUNITY): Payer: Self-pay

## 2023-04-30 ENCOUNTER — Other Ambulatory Visit (HOSPITAL_COMMUNITY): Payer: Self-pay

## 2023-05-05 ENCOUNTER — Other Ambulatory Visit (HOSPITAL_COMMUNITY): Payer: Self-pay

## 2023-09-30 ENCOUNTER — Other Ambulatory Visit (HOSPITAL_COMMUNITY): Payer: Self-pay
